# Patient Record
Sex: Female | Born: 1960 | Hispanic: No | Marital: Married
Health system: Southern US, Community
[De-identification: ages and names within clinical notes are randomized; demographics above are authoritative.]

---

## 2019-04-24 ENCOUNTER — Other Ambulatory Visit: Payer: Self-pay | Admitting: Neurosurgery

## 2019-04-24 DIAGNOSIS — M4316 Spondylolisthesis, lumbar region: Secondary | ICD-10-CM

## 2019-04-26 ENCOUNTER — Ambulatory Visit
Admission: RE | Admit: 2019-04-26 | Discharge: 2019-04-26 | Disposition: A | Discharge status: Home / Self Care | Payer: BC Managed Care – PPO | Source: Ambulatory Visit | Attending: Neurosurgery | Admitting: Neurosurgery

## 2019-04-26 DIAGNOSIS — M4316 Spondylolisthesis, lumbar region: Secondary | ICD-10-CM

## 2019-04-26 IMAGING — MR MR LUMBAR SPINE W/O CM
4 of 5 series · 26 of 48 positions shown · non-contrast
Comparison: MRI [DATE]

CLINICAL DATA: Severe lumbar back pain since [REDACTED]. Pain radiates to
both legs with numbness and weakness.

EXAM:
MRI LUMBAR SPINE WITHOUT CONTRAST
TECHNIQUE: Multiplanar, multisequence MR imaging of the lumbar spine was
performed. No intravenous contrast was administered.

[Series 3: T2 · sagittal · 4.0mm · 0.55mm/px · 6 of 15 slices shown (1 of 2)]
[im 1/15]
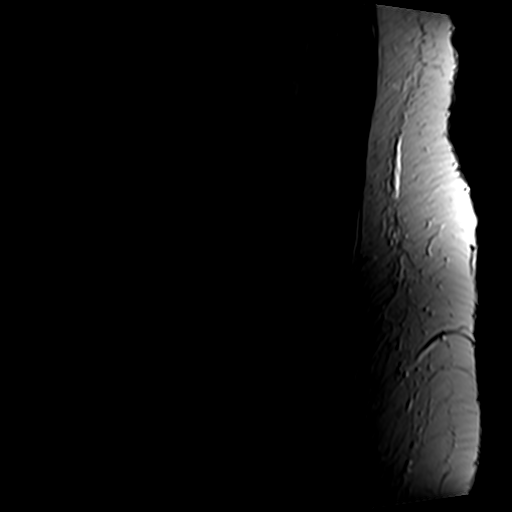
[im 3/15]
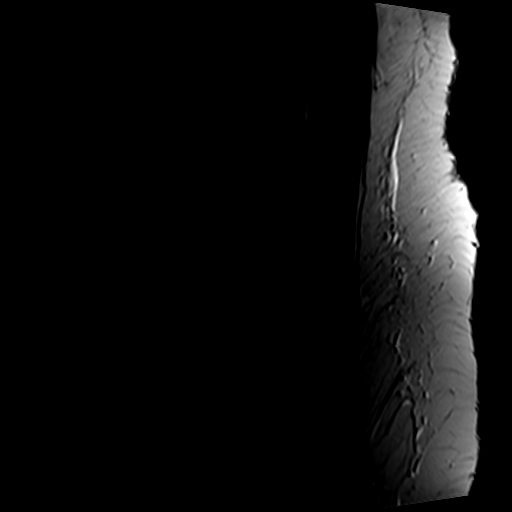
[im 6/15]
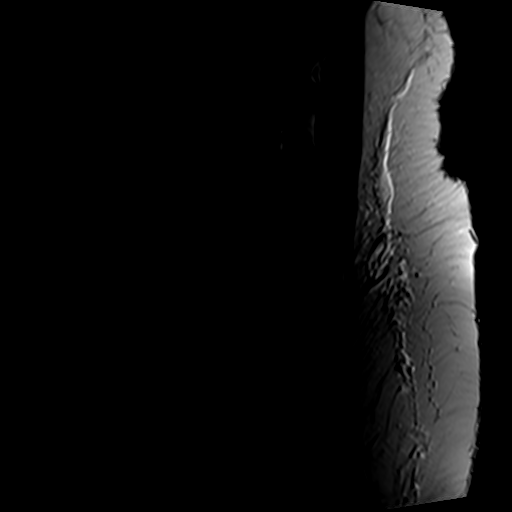
[im 9/15]
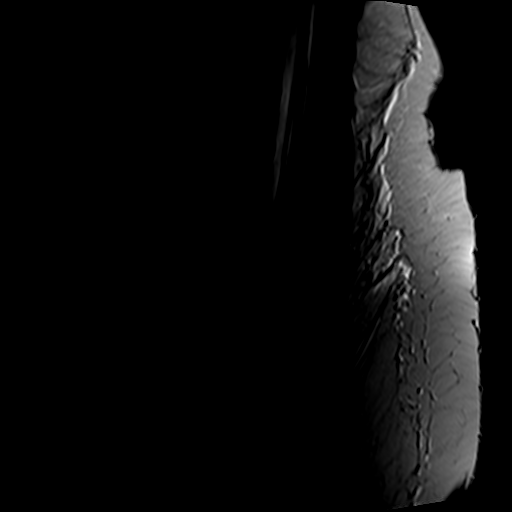
[im 12/15]
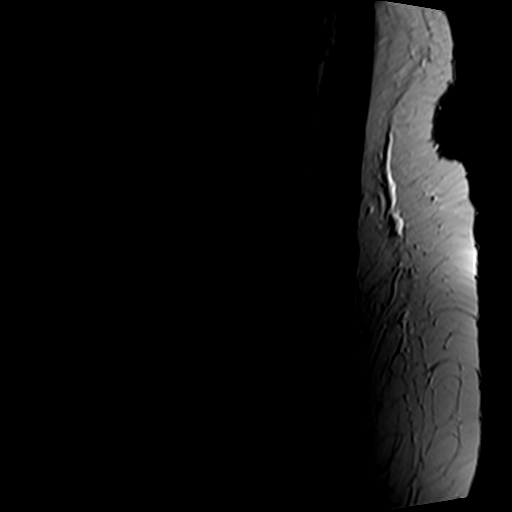
[im 15/15]
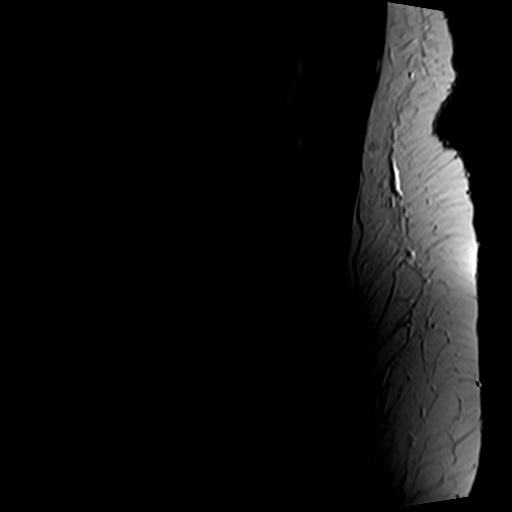

[Series 4: T1 · sagittal · 4.0mm · 0.55mm/px · 6 of 15 slices shown (1 of 2)]
[im 1/15]
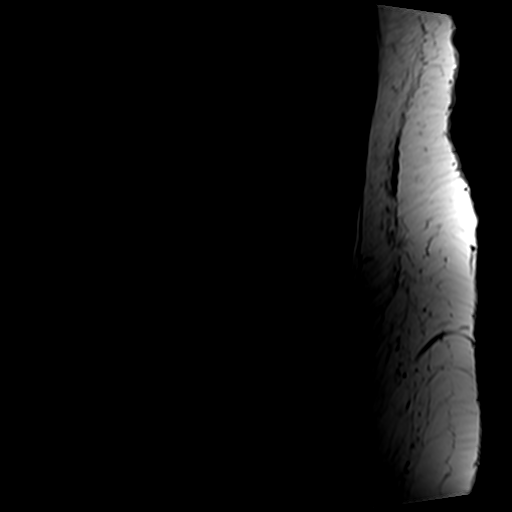
[im 3/15]
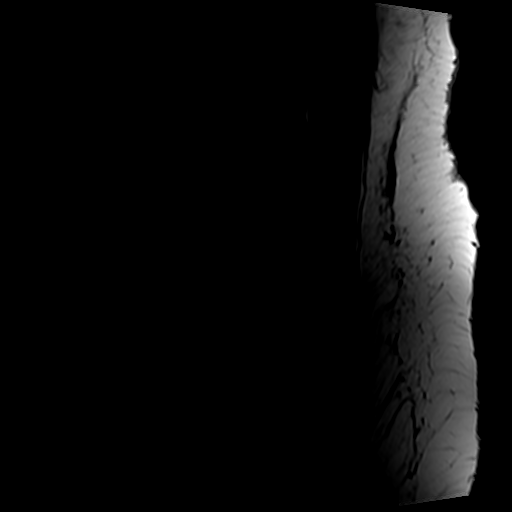
[im 6/15]
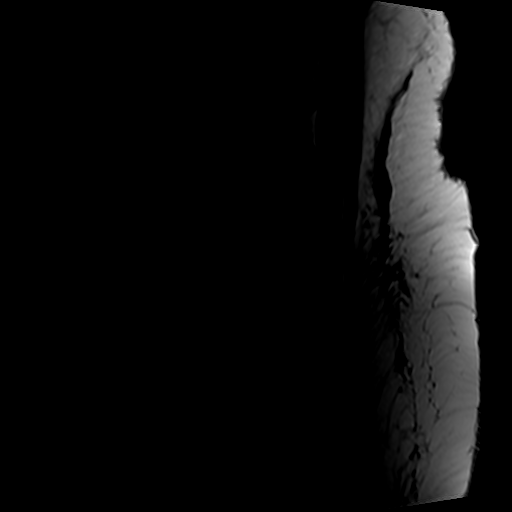
[im 9/15]
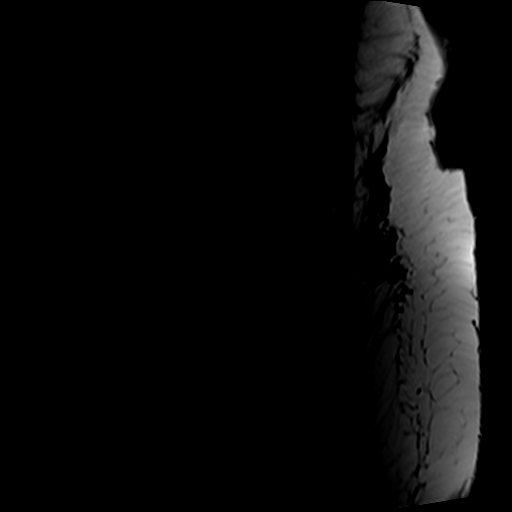
[im 12/15]
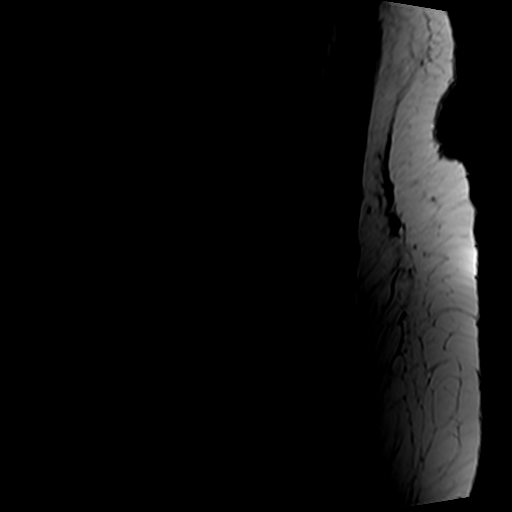
[im 15/15]
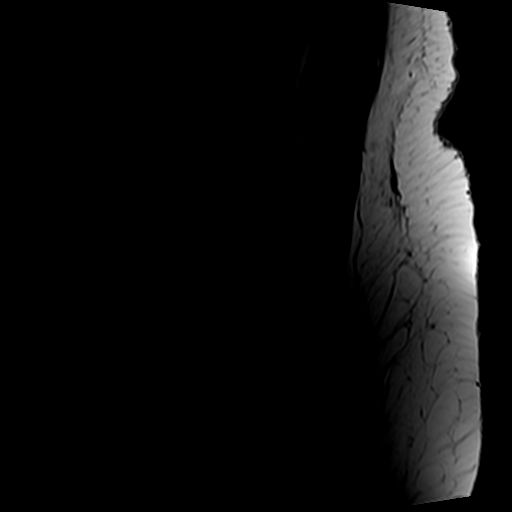

[Series 6: T2 · axial · 4.0mm · 0.70mm/px · z∈[-140,+62]mm · 9 of 37 slices shown (2 of 2)]
[im 1/37]
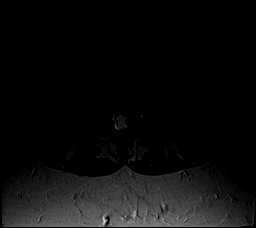
[im 6/37]
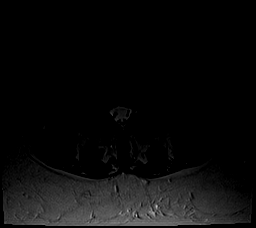
[im 11/37]
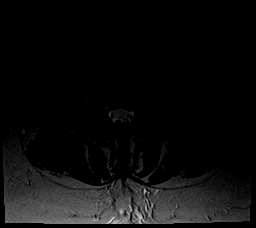
[im 16/37]
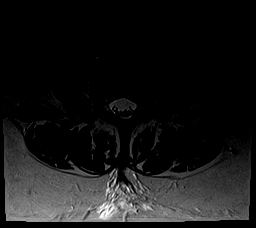
[im 19/37]
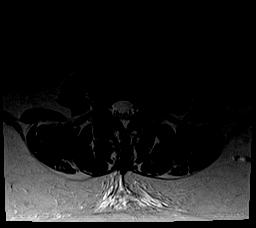
[im 21/37]
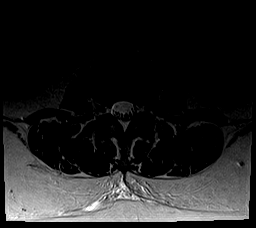
[im 26/37]
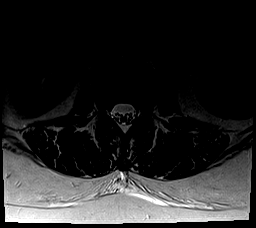
[im 31/37]
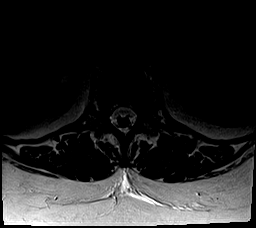
[im 37/37]
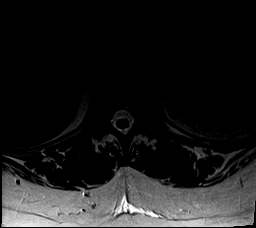

[Series 7: T1 · axial · 4.0mm · 0.35mm/px · z∈[-140,+32]mm · 5 of 37 slices shown (2 of 2)]
[im 1/37]
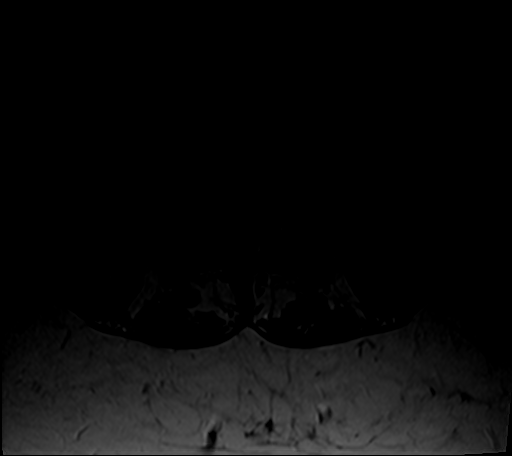
[im 6/37]
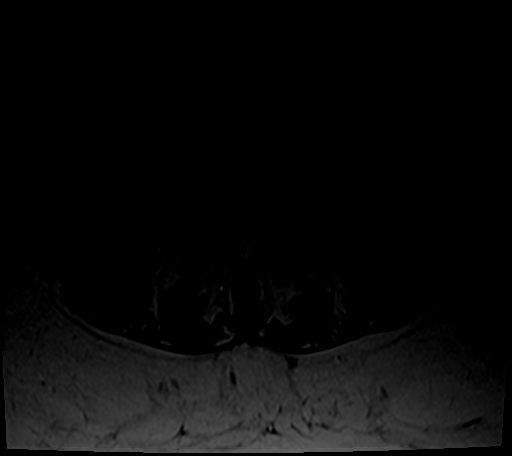
[im 11/37]
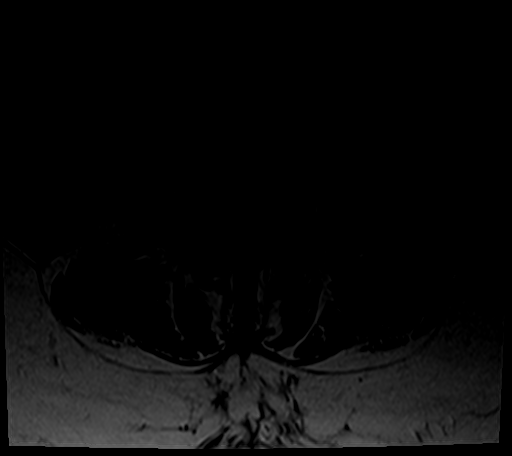
[im 19/37]
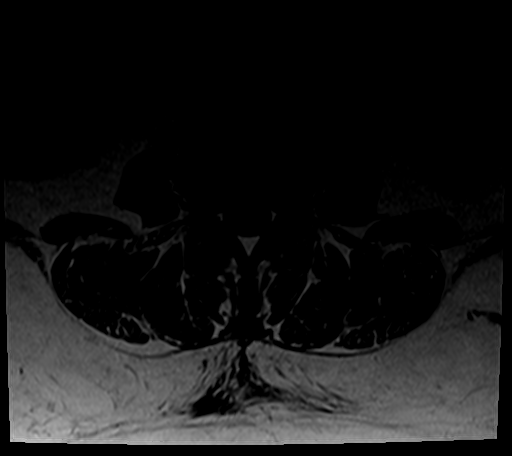
[im 31/37]
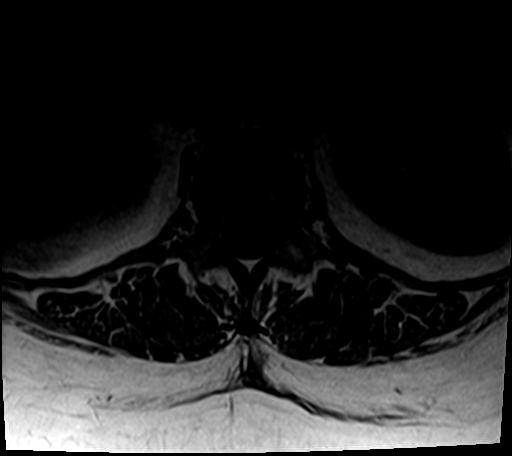

[26 of 48 positions shown; findings below may reference images not displayed]

FINDINGS: Segmentation:  5 lumbar type vertebral bodies.

Alignment:  3 mm degenerative anterolisthesis L4-5.

Vertebrae:  No fracture or primary bone lesion.

Conus medullaris and cauda equina: Conus extends to the L1-2 level.
Conus and cauda equina appear normal.

Paraspinal and other soft tissues: Negative

Disc levels:

T12-L1 is normal.

L1-2 shows minimal bulging of the disc but no compressive stenosis.

L2-3 and L3-4: Normal

L4-5: Bilateral facet osteoarthritis allows 3 mm of anterolisthesis.
Fluid-filled facet joint on the left with facet and ligamentous
hypertrophy. Mild bulging of the disc. No apparent compressive
stenosis. This appearance could worsen with standing or flexion.
Findings at this level could certainly be associated with back pain
or referred facet syndrome pain.

L5-S1: Normal appearance of the disc. Mild bilateral facet
osteoarthritis without slippage or encroachment upon the neural
spaces. Facet arthropathy could contribute to back pain or referred
facet syndrome pain.
IMPRESSION: No appreciable change since the study of [LM].

L4-5: Chronic facet arthropathy with 3 mm of anterolisthesis. Mild
bulging of the disc. No compressive stenosis. The appearance could
worsen with standing or flexion. The facet arthropathy could be a
cause of low back pain or referred facet syndrome pain.

L5-S1: No disc pathology. Bilateral facet osteoarthritis without
slippage or encroachment upon the neural spaces. This could
contribute to low back pain or referred facet syndrome pain.

## 2019-05-20 ENCOUNTER — Other Ambulatory Visit: Payer: Self-pay

## 2019-05-20 ENCOUNTER — Ambulatory Visit: Payer: BC Managed Care – PPO | Attending: Student

## 2019-05-20 DIAGNOSIS — M5416 Radiculopathy, lumbar region: Secondary | ICD-10-CM | POA: Diagnosis not present

## 2019-05-20 DIAGNOSIS — G8929 Other chronic pain: Secondary | ICD-10-CM | POA: Diagnosis present

## 2019-05-20 DIAGNOSIS — M5440 Lumbago with sciatica, unspecified side: Secondary | ICD-10-CM | POA: Diagnosis present

## 2019-05-20 DIAGNOSIS — M62830 Muscle spasm of back: Secondary | ICD-10-CM | POA: Diagnosis present
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
## 2019-05-20 NOTE — Patient Instructions (Signed)
Patient instructed to perform A and F

## 2019-05-28 ENCOUNTER — Encounter: Payer: Self-pay | Admitting: Physical Therapy

## 2019-05-28 ENCOUNTER — Ambulatory Visit: Payer: BC Managed Care – PPO | Admitting: Physical Therapy

## 2019-05-28 ENCOUNTER — Other Ambulatory Visit: Payer: Self-pay

## 2019-05-28 DIAGNOSIS — G8929 Other chronic pain: Secondary | ICD-10-CM

## 2019-05-28 DIAGNOSIS — M5416 Radiculopathy, lumbar region: Secondary | ICD-10-CM | POA: Diagnosis not present

## 2019-05-28 DIAGNOSIS — M5440 Lumbago with sciatica, unspecified side: Secondary | ICD-10-CM

## 2019-05-28 DIAGNOSIS — M62830 Muscle spasm of back: Secondary | ICD-10-CM

## 2019-05-28 NOTE — Therapy (Signed)
[facility] [address] [place], [place], [address] Phone: [phone]   Fax:  [phone]  Physical Therapy Treatment  Patient Details  Name: Shannon Coleman MRN: [id number] Date of Birth: April 15, 1960 No data recorded  Encounter Date: 05/28/2019  PT End of Session - 05/28/19 1212    Visit Number  2    Date for PT Re-Evaluation  06/17/19    Authorization Type  [employer] Blue [employer]    PT Start Time  1145    PT Stop Time  1225    PT Time Calculation (min)  40 min    Activity Tolerance  Patient limited by pain    Behavior During Therapy  Anxious       History reviewed. No pertinent past medical history.  History reviewed. No pertinent surgical history.  There were no vitals filed for this visit.  Subjective Assessment - 05/28/19 1148    Subjective  "Better than usual" Has about three of these days in the month    Currently in Pain?  Yes    Pain Score  5     Pain Location  Back                       [facility] Adult PT Treatment/Exercise - 05/28/19 0001      Lumbar Exercises: Aerobic   UBE (Upper Arm Bike)  L1 1.5 each way    Nustep  L3 x 4 in    R side low back pain     Lumbar Exercises: Seated   Long Arc Quad on Chair  AROM;1 set;10 reps   pain on the lower L with LLE    Other Seated Lumbar Exercises  Red band ER 2x10, Ball squeezes, hip abduction.    Other Seated Lumbar Exercises  HS curls red unable to complete due to spasums in the LLE causing pain in the low back on the L side      Modalities   Modalities  Cryotherapy      Cryotherapy   Number Minutes Cryotherapy  10 Minutes    Cryotherapy Location  Lumbar Spine    Type of Cryotherapy  Ice pack               PT Short Term Goals - 05/20/19 1124      PT SHORT TERM GOAL #1   Title  Pt will be tolerate 15 minutes of standing with improved body mechanics, i.e. neutral pelvis and decreased lumbar lordosis.    Baseline   Appearance of no core activation and increased anterior pelvic tilt/lumbar lordosis    Time  2    Period  Weeks    Status  New    Target Date  06/03/19      PT SHORT TERM GOAL #2   Title  Pt will be independent with initial HEP.    Time  1    Period  Weeks    Status  New    Target Date  05/27/19      PT SHORT TERM GOAL #3   Title  Pt will report decrease of pain to <7/10 on daily basis.    Baseline  7.5/10 or greater    Time  2    Period  Weeks    Status  New    Target Date  06/03/19        PT Long Term Goals - 05/20/19 1127  PT LONG TERM GOAL #1   Title  Pt will tolerate 20 minutes of walking, in order to participate in grocery shopping and community activity, with 3/10 pain or less.    Baseline  7.5/10 pain    Time  4    Period  Weeks    Status  New    Target Date  06/17/19      PT LONG TERM GOAL #2   Title  Pt will demonstrate proper posture with improved lumbopelvic mechanics/stability with standing and ambulating, in order to decrease pain levels.    Baseline  Significant lumbar lordosis and anterior pelvic tilt    Time  4    Period  Weeks    Status  New    Target Date  06/17/19      PT LONG TERM GOAL #3   Title  Pt will stand for 20 minutes with <3/10 pain to participate in community activity.    Baseline  5-10 min    Time  4    Period  Weeks    Status  New    Target Date  06/17/19      PT LONG TERM GOAL #4   Title  Pt will negotiate 2 flights of steps, in order to access 2nd story at home with <3/10 pain.    Baseline  only accesses in emergency due to potential for constant over 2-3 days following    Time  4    Period  Weeks    Status  New    Target Date  06/17/19            Plan - 05/28/19 1215    Clinical Impression Statement  Today's PT session was limited by pain. Pt enters clinic reporting it was one of her better days. No issue with UBE warm up. PT reports that NuStep did cause a spasms in her low back on the R side, she stated she  has not has a spasms there in a while. Postural cues needed for seated rows. All LE interventions with little to to no resistance had to be stopped due to LE spasms increasing LBP. HS curls cause shaky muscle contractions and low back pain on the L side. LAQ also increased LBP. Pt reports that she would like to because she knows she will suffer later.    Personal Factors and Comorbidities  Age;Fitness;Behavior Pattern;Past/Current Experience;Social Background;Time since onset of injury/illness/exacerbation;Comorbidity 1    Comorbidities  obesity    Examination-Activity Limitations  Lift;Stand;Locomotion Level;Bend;Carry;Stairs;Squat;Sit    Examination-Participation Restrictions  Cleaning;Meal Prep;Shop;Community Activity    Stability/Clinical Decision Making  Stable/Uncomplicated    Rehab Potential  Good    PT Frequency  2x / week    PT Treatment/Interventions  ADLs/Self Care Home Management;Iontophoresis 4mg /ml Dexamethasone;Cryotherapy;Electrical Stimulation;Traction;Moist Heat;Stair training;Gait training;Functional mobility training;Therapeutic activities;Therapeutic exercise;Patient/family education;Neuromuscular re-education;Balance training;Manual techniques;Compression bandaging;Vasopneumatic Device;Taping;Joint Manipulations;Spinal Manipulations;Energy conservation;Passive range of motion;Dry needling    PT Next Visit Plan  assess Tx, try to get pyt moving withou causing pain.       Patient will benefit from skilled therapeutic intervention in order to improve the following deficits and impairments:  Abnormal gait, Increased fascial restricitons, Improper body mechanics, Pain, Postural dysfunction, Increased muscle spasms, Decreased mobility, Decreased activity tolerance, Decreased endurance, Decreased range of motion, Decreased strength, Hypomobility, Impaired perceived functional ability, Obesity, Impaired flexibility, Difficulty walking, Decreased balance  Visit Diagnosis: Chronic low  back pain with sciatica, sciatica laterality unspecified, unspecified back pain laterality  Muscle spasm of back  Radiculopathy, lumbar region     Problem List There are no problems to display for this patient.   Scot Jun 05/28/2019, 12:21 PM  [facility] [address] [place], [place], [address] Phone: [phone]   Fax:  [phone]  Name: Shannon Coleman MRN: [id number] Date of Birth: 16-Apr-1960

## 2019-06-03 ENCOUNTER — Ambulatory Visit: Payer: BC Managed Care – PPO | Admitting: Physical Therapy

## 2019-06-05 ENCOUNTER — Ambulatory Visit: Payer: BC Managed Care – PPO | Admitting: Physical Therapy

## 2019-06-09 ENCOUNTER — Ambulatory Visit: Payer: BC Managed Care – PPO | Attending: Student | Admitting: Physical Therapy

## 2019-06-09 ENCOUNTER — Other Ambulatory Visit: Payer: Self-pay

## 2019-06-09 ENCOUNTER — Encounter: Payer: Self-pay | Admitting: Physical Therapy

## 2019-06-09 DIAGNOSIS — M5440 Lumbago with sciatica, unspecified side: Secondary | ICD-10-CM | POA: Diagnosis not present

## 2019-06-09 DIAGNOSIS — G8929 Other chronic pain: Secondary | ICD-10-CM | POA: Diagnosis present

## 2019-06-09 DIAGNOSIS — M62830 Muscle spasm of back: Secondary | ICD-10-CM | POA: Diagnosis present

## 2019-06-09 DIAGNOSIS — M5416 Radiculopathy, lumbar region: Secondary | ICD-10-CM | POA: Diagnosis present

## 2019-06-09 NOTE — Therapy (Signed)
[facility] [address] [place], [place], [address] Phone: [phone]   Fax:  [phone]  Physical Therapy Treatment  Patient Details  Name: Shannon Coleman MRN: [id number] Date of Birth: 1960-08-21 No data recorded  Encounter Date: 06/09/2019  PT End of Session - 06/09/19 1130    Visit Number  3    Date for PT Re-Evaluation  06/17/19    Authorization Type  [employer]    PT Start Time  1100    PT Stop Time  1136    PT Time Calculation (min)  36 min    Activity Tolerance  Patient limited by pain    Behavior During Therapy  Anxious       History reviewed. No pertinent past medical history.  History reviewed. No pertinent surgical history.  There were no vitals filed for this visit.  Subjective Assessment - 06/09/19 1101    Subjective  "Fair" Had to cancel last session because she picked up a ham on easter and that put her out for two days.    Currently in Pain?  Yes    Pain Score  6     Pain Location  Back                       [facility] Adult PT Treatment/Exercise - 06/09/19 0001      Lumbar Exercises: Standing   Other Standing Lumbar Exercises  Stand marches 2x10 UE assist x2      Lumbar Exercises: Seated   Other Seated Lumbar Exercises  Rows & Ext Red tband 2x15; ER red 2x10               PT Short Term Goals - 05/20/19 1124      PT SHORT TERM GOAL #1   Title  Pt will be tolerate 15 minutes of standing with improved body mechanics, i.e. neutral pelvis and decreased lumbar lordosis.    Baseline  Appearance of no core activation and increased anterior pelvic tilt/lumbar lordosis    Time  2    Period  Weeks    Status  New    Target Date  06/03/19      PT SHORT TERM GOAL #2   Title  Pt will be independent with initial HEP.    Time  1    Period  Weeks    Status  New    Target Date  05/27/19      PT SHORT TERM GOAL #3   Title  Pt will report decrease of pain to  <7/10 on daily basis.    Baseline  7.5/10 or greater    Time  2    Period  Weeks    Status  New    Target Date  06/03/19        PT Long Term Goals - 05/20/19 1127      PT LONG TERM GOAL #1   Title  Pt will tolerate 20 minutes of walking, in order to participate in grocery shopping and community activity, with 3/10 pain or less.    Baseline  7.5/10 pain    Time  4    Period  Weeks    Status  New    Target Date  06/17/19      PT LONG TERM GOAL #2   Title  Pt will demonstrate proper posture with improved lumbopelvic mechanics/stability with standing and ambulating, in order to decrease pain levels.  Baseline  Significant lumbar lordosis and anterior pelvic tilt    Time  4    Period  Weeks    Status  New    Target Date  06/17/19      PT LONG TERM GOAL #3   Title  Pt will stand for 20 minutes with <3/10 pain to participate in community activity.    Baseline  5-10 min    Time  4    Period  Weeks    Status  New    Target Date  06/17/19      PT LONG TERM GOAL #4   Title  Pt will negotiate 2 flights of steps, in order to access 2nd story at home with <3/10 pain.    Baseline  only accesses in emergency due to potential for constant over 2-3 days following    Time  4    Period  Weeks    Status  New    Target Date  06/17/19            Plan - 06/09/19 1130    Clinical Impression Statement  Shorten Tx per pt request. Pt agreeable to UE interventions because LE interventions causes spasms and pain. She was able to complete all the exercises. She did however report a ice pick stabbing sensation ion the L side of her lo back. Tense posture when sitting due to pain.    Personal Factors and Comorbidities  Age;Fitness;Behavior Pattern;Past/Current Experience;Social Background;Time since onset of injury/illness/exacerbation;Comorbidity 1    Comorbidities  obesity    Examination-Activity Limitations  Lift;Stand;Locomotion Level;Bend;Carry;Stairs;Squat;Sit     Examination-Participation Restrictions  Cleaning;Meal Prep;Shop;Community Activity    Stability/Clinical Decision Making  Stable/Uncomplicated    Rehab Potential  Good    PT Frequency  2x / week    PT Treatment/Interventions  ADLs/Self Care Home Management;Iontophoresis 4mg /ml Dexamethasone;Cryotherapy;Electrical Stimulation;Traction;Moist Heat;Stair training;Gait training;Functional mobility training;Therapeutic activities;Therapeutic exercise;Patient/family education;Neuromuscular re-education;Balance training;Manual techniques;Compression bandaging;Vasopneumatic Device;Taping;Joint Manipulations;Spinal Manipulations;Energy conservation;Passive range of motion;Dry needling    PT Next Visit Plan  assess Tx, try to get pyt moving withou causing pain.       Patient will benefit from skilled therapeutic intervention in order to improve the following deficits and impairments:  Abnormal gait, Increased fascial restricitons, Improper body mechanics, Pain, Postural dysfunction, Increased muscle spasms, Decreased mobility, Decreased activity tolerance, Decreased endurance, Decreased range of motion, Decreased strength, Hypomobility, Impaired perceived functional ability, Obesity, Impaired flexibility, Difficulty walking, Decreased balance  Visit Diagnosis: Chronic low back pain with sciatica, sciatica laterality unspecified, unspecified back pain laterality  Muscle spasm of back  Radiculopathy, lumbar region     Problem List There are no problems to display for this patient.   Scot Jun 06/09/2019, 11:36 AM  [place] [address] [place], [place], [address] Phone: [phone]   Fax:  [phone]  Name: Anabeth Chilcott MRN: [id number] Date of Birth: 12/27/1960

## 2020-06-29 ENCOUNTER — Other Ambulatory Visit: Payer: Self-pay | Admitting: Neurosurgery

## 2020-07-14 ENCOUNTER — Other Ambulatory Visit: Payer: Self-pay | Admitting: Neurosurgery

## 2020-07-14 DIAGNOSIS — M4316 Spondylolisthesis, lumbar region: Secondary | ICD-10-CM

## 2020-07-31 ENCOUNTER — Ambulatory Visit
Admission: RE | Admit: 2020-07-31 | Discharge: 2020-07-31 | Disposition: A | Discharge status: Home / Self Care | Payer: BC Managed Care – PPO | Source: Ambulatory Visit | Attending: Neurosurgery | Admitting: Neurosurgery

## 2020-07-31 DIAGNOSIS — M4316 Spondylolisthesis, lumbar region: Secondary | ICD-10-CM

## 2020-07-31 IMAGING — MR MR LUMBAR SPINE W/O CM
4 of 9 series · 15 of 48 positions shown · non-contrast
Comparison: [DATE]

CLINICAL DATA: Bilateral back pain extending into the legs for 3
years.

EXAM:
MRI LUMBAR SPINE WITHOUT CONTRAST
TECHNIQUE: Multiplanar, multisequence MR imaging of the lumbar spine was
performed. No intravenous contrast was administered.

[Series 5: T2 · sagittal · 4.0mm · 0.73mm/px · 4 of 15 slices shown (1 of 4)]
[im 1/15]
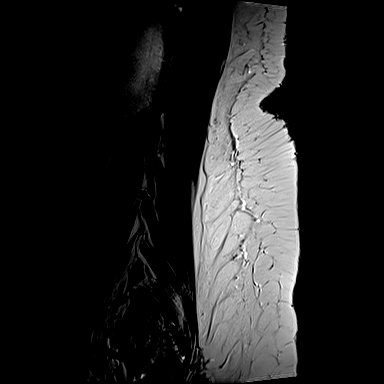
[im 5/15]
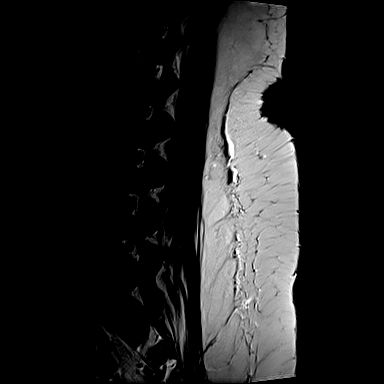
[im 10/15]
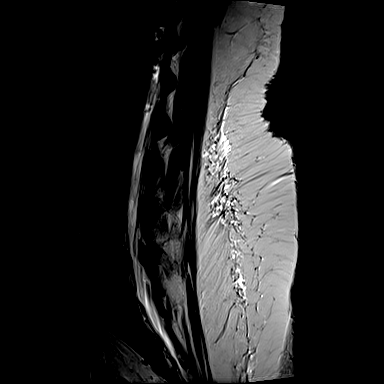
[im 15/15]
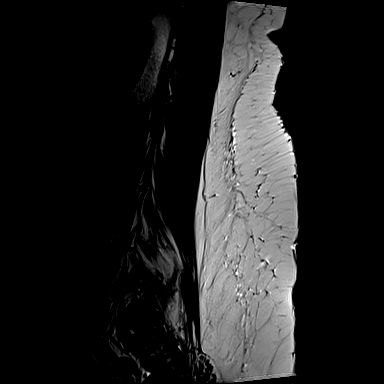

[Series 11: T2 · axial · 4.0mm · 0.28mm/px · z∈[+1,+91]mm · 4 of 19 slices shown (2 of 4)]
[im 1/19]
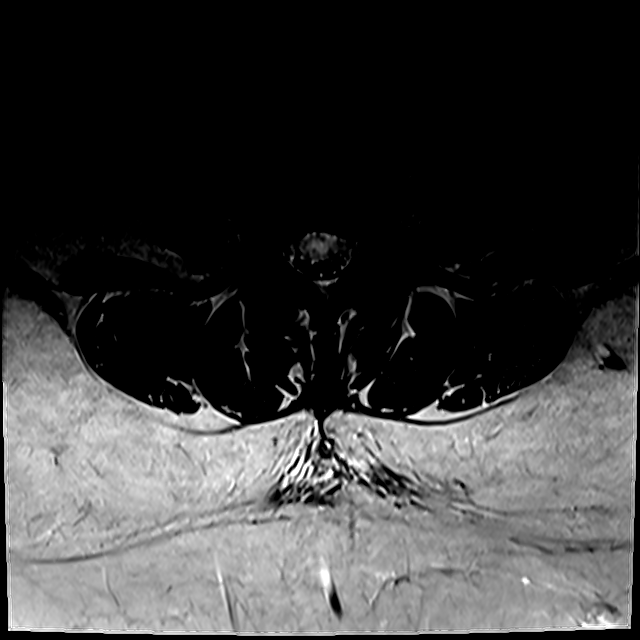
[im 7/19]
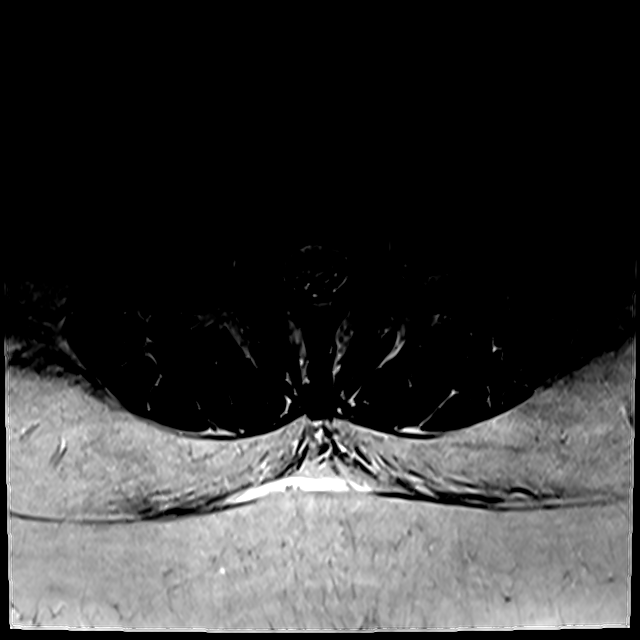
[im 13/19]
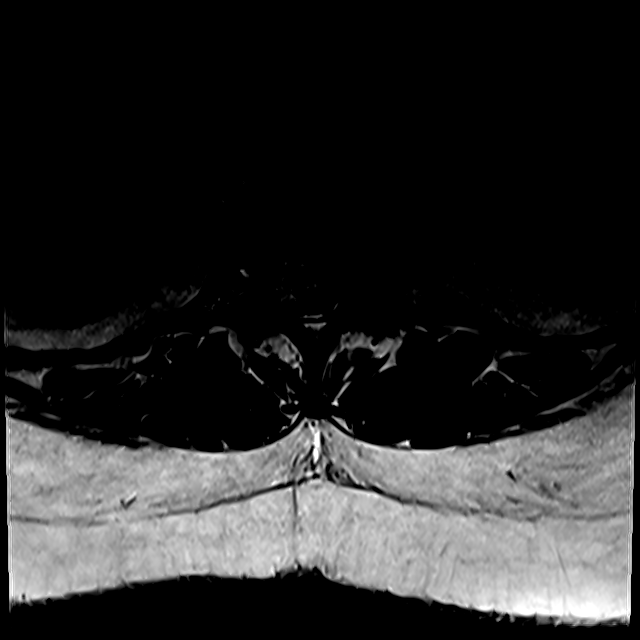
[im 19/19]
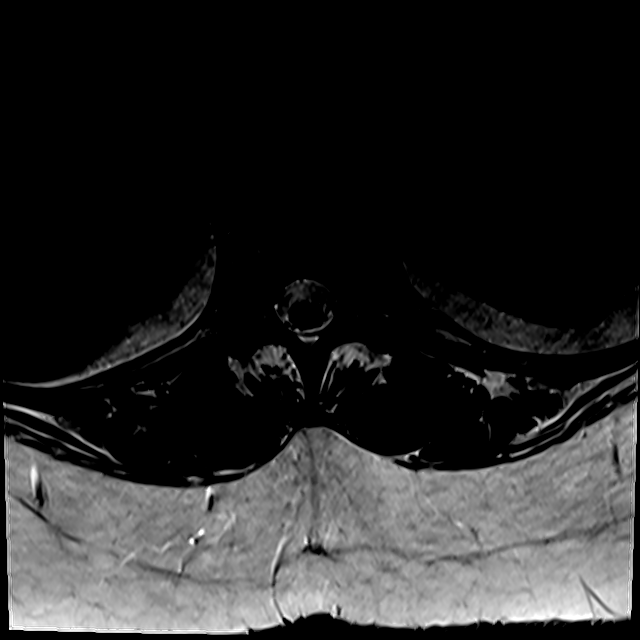

[Series 12: T2 · axial · 4.0mm · 0.28mm/px · z∈[-118,-14]mm · 4 of 22 slices shown (3 of 4)]
[im 1/22]
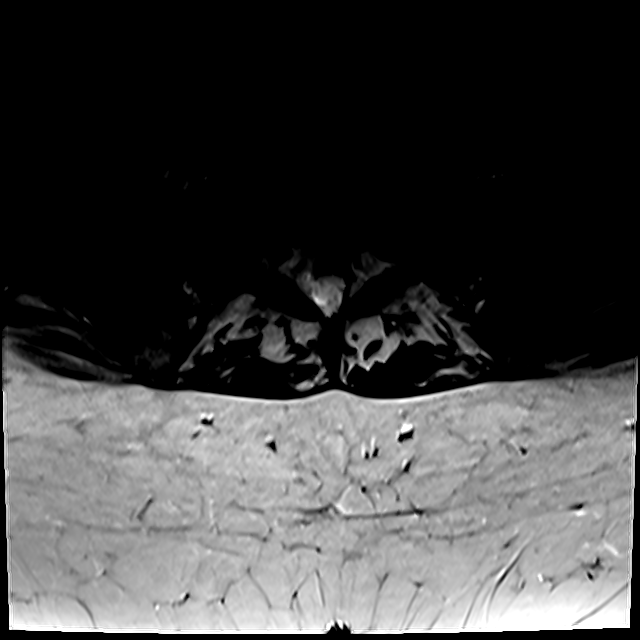
[im 6/22]
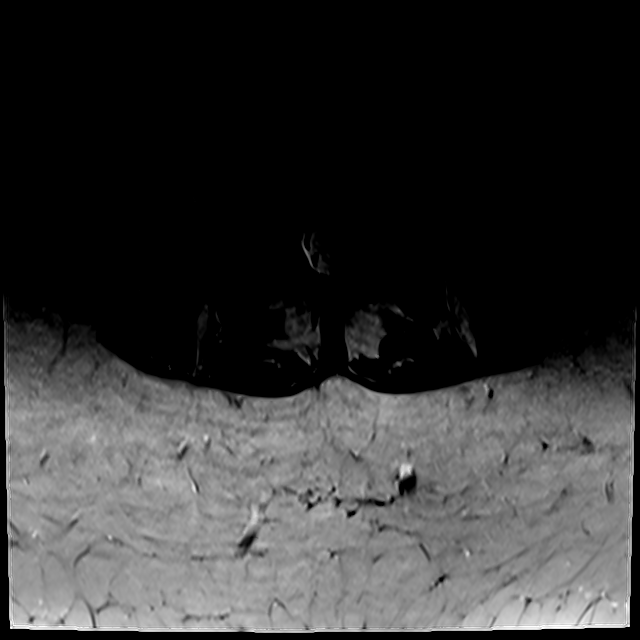
[im 11/22]
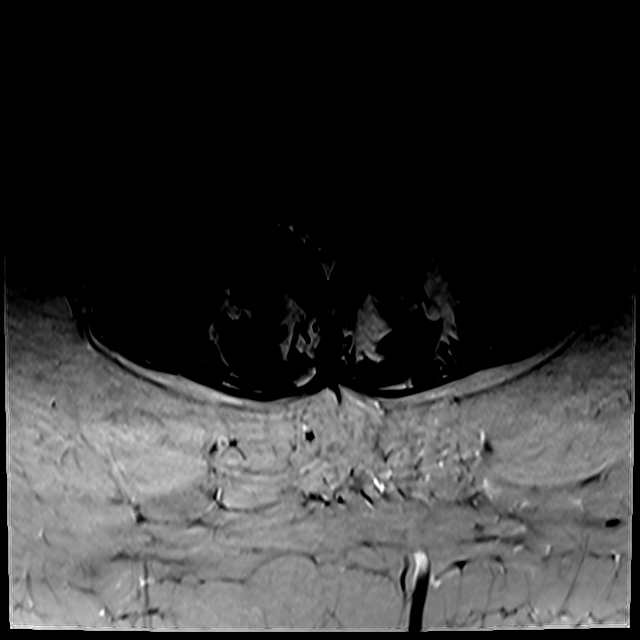
[im 22/22]
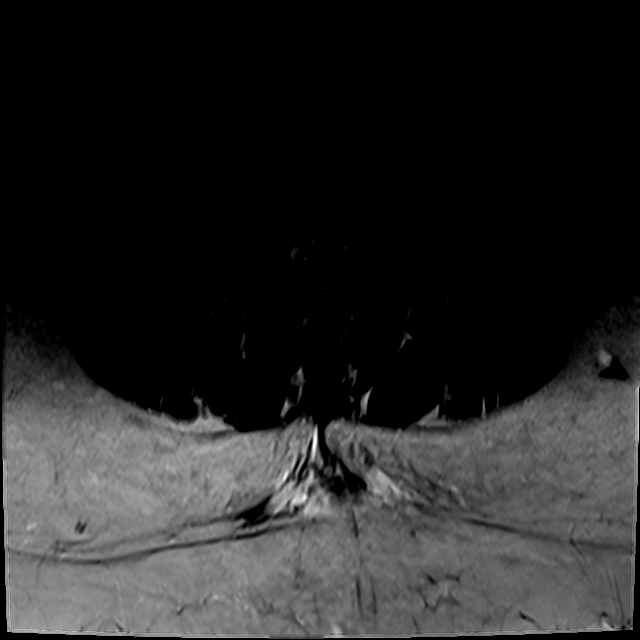

[Series 13: T2 · axial · 4.0mm · 0.28mm/px · z∈[-93,+66]mm · 3 of 41 slices shown (4 of 4)]
[im 6/41]
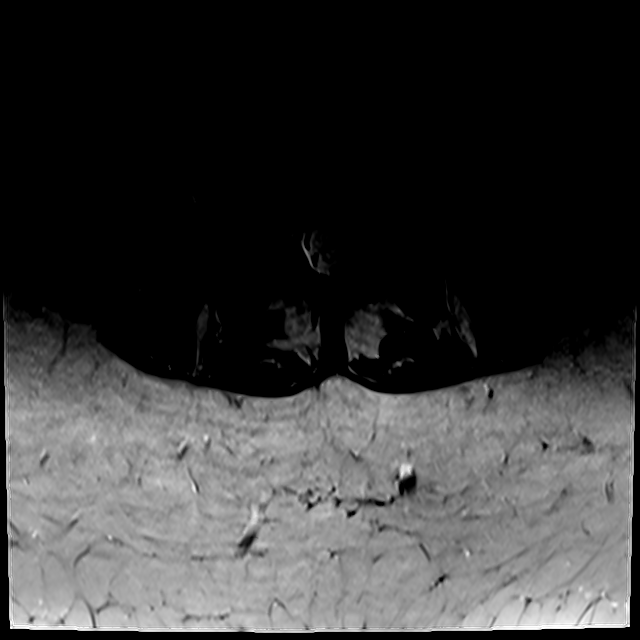
[im 21/41]
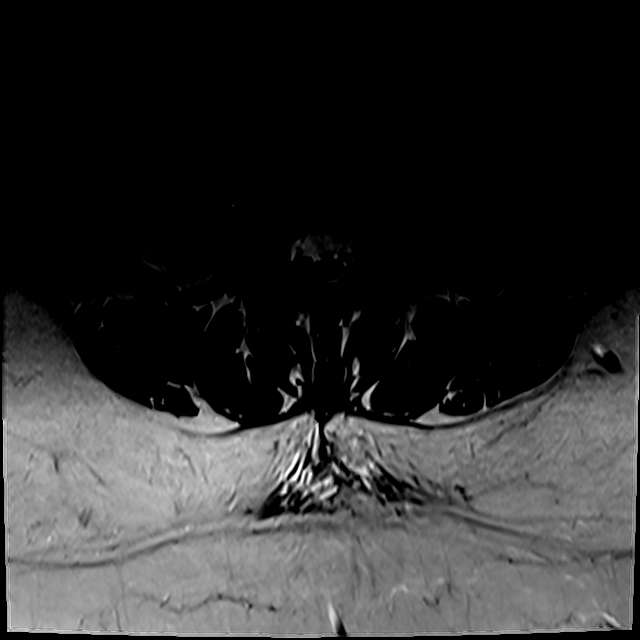
[im 36/41]
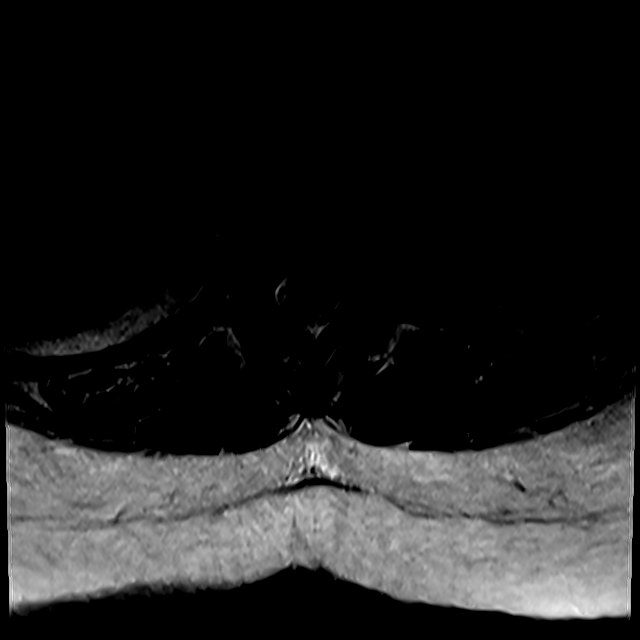

[15 of 48 positions shown; findings below may reference images not displayed]

FINDINGS: Despite efforts by the technologist and patient, motion artifact is
present on today's exam and could not be eliminated. This reduces
exam sensitivity and specificity.

Segmentation: The lowest lumbar type non-rib-bearing vertebra is
labeled as L5.

Alignment: 4 mm degenerative anterolisthesis at L4-5, similar to
prior.

Vertebrae: Small hemangioma in the L3 vertebral body. Mild type 2
degenerative endplate findings at L4-5 with loss of disc height.
Disc desiccation primarily at L4-5. Small hemangioma eccentric to
the right in the T12 vertebral body. Degenerative facet edema on the
right at L5-S1.

Conus medullaris and cauda equina: Conus extends to the upper L2
level. Conus and cauda equina appear normal.

Paraspinal and other soft tissues: Suspected large uterine fibroids,
but only partially included on today's exam.

Disc levels:

T12-L1: Unremarkable.

L1-2: No impingement.  Small central disc protrusion.

L2-3: Unremarkable.

L3-4: No impingement.  Mild facet arthropathy.

L4-5: Borderline left subarticular lateral recess stenosis due to
facet arthropathy and disc uncovering.

L5-S1: No impingement. Bilateral degenerative facet arthropathy,
right greater than left.
IMPRESSION: 1. Lumbar spondylosis and degenerative disc disease, with borderline
left subarticular lateral recess stenosis at L4-5 but no overt
degree of impingement identified.
2. Suspected large uterine fibroids.
3. 4 mm of chronic degenerative anterolisthesis at L4-5.

## 2020-09-09 ENCOUNTER — Inpatient Hospital Stay: Admit: 2020-09-09 | Payer: BC Managed Care – PPO | Admitting: Neurosurgery

## 2020-09-09 SURGERY — POSTERIOR LUMBAR FUSION 1 LEVEL
Anesthesia: General
